# Patient Record
Sex: Male | Born: 1975 | Race: White | Hispanic: No | Marital: Married | Smoking: Never smoker
Health system: Southern US, Community
[De-identification: ages and names within clinical notes are randomized; demographics above are authoritative.]

## PROBLEM LIST (undated history)

## (undated) DIAGNOSIS — I10 Essential (primary) hypertension: Secondary | ICD-10-CM

## (undated) HISTORY — PX: HERNIA REPAIR: SHX51

## (undated) HISTORY — PX: SHOULDER SURGERY: SHX246

## (undated) HISTORY — PX: TONSILLECTOMY: SUR1361

## (undated) HISTORY — PX: VASCULAR SURGERY: SHX849

---

## 2011-12-28 ENCOUNTER — Emergency Department (INDEPENDENT_AMBULATORY_CARE_PROVIDER_SITE_OTHER)

## 2011-12-28 ENCOUNTER — Encounter (HOSPITAL_BASED_OUTPATIENT_CLINIC_OR_DEPARTMENT_OTHER): Payer: Self-pay

## 2011-12-28 ENCOUNTER — Other Ambulatory Visit: Payer: Self-pay

## 2011-12-28 ENCOUNTER — Emergency Department (HOSPITAL_BASED_OUTPATIENT_CLINIC_OR_DEPARTMENT_OTHER)
Admission: EM | Admit: 2011-12-28 | Discharge: 2011-12-28 | Disposition: A | Discharge status: Home / Self Care | Attending: Emergency Medicine | Admitting: Emergency Medicine

## 2011-12-28 DIAGNOSIS — R0989 Other specified symptoms and signs involving the circulatory and respiratory systems: Secondary | ICD-10-CM | POA: Insufficient documentation

## 2011-12-28 DIAGNOSIS — R0602 Shortness of breath: Secondary | ICD-10-CM | POA: Insufficient documentation

## 2011-12-28 DIAGNOSIS — R079 Chest pain, unspecified: Secondary | ICD-10-CM | POA: Insufficient documentation

## 2011-12-28 DIAGNOSIS — R911 Solitary pulmonary nodule: Secondary | ICD-10-CM

## 2011-12-28 DIAGNOSIS — Z9889 Other specified postprocedural states: Secondary | ICD-10-CM | POA: Insufficient documentation

## 2011-12-28 DIAGNOSIS — R0600 Dyspnea, unspecified: Secondary | ICD-10-CM

## 2011-12-28 DIAGNOSIS — R0609 Other forms of dyspnea: Secondary | ICD-10-CM | POA: Insufficient documentation

## 2011-12-28 LAB — COMPREHENSIVE METABOLIC PANEL
ALT: 19 U/L (ref 0–53)
Alkaline Phosphatase: 66 U/L (ref 39–117)
CO2: 23 mEq/L (ref 19–32)
Chloride: 103 mEq/L (ref 96–112)
GFR calc Af Amer: >90 mL/min (ref >90)
Glucose, Bld: 124 mg/dL — ABNORMAL HIGH (ref 70–99)
Potassium: 4.4 mEq/L (ref 3.5–5.1)
Sodium: 138 mEq/L (ref 135–145)
Total Bilirubin: 0.9 mg/dL (ref 0.3–1.2)
Total Protein: 7.7 g/dL (ref 6.0–8.3)

## 2011-12-28 LAB — DIFFERENTIAL
Eosinophils Absolute: 0 10*3/uL (ref 0.0–0.7)
Lymphocytes Relative: 8 % — ABNORMAL LOW (ref 12–46)
Lymphs Abs: 1.3 10*3/uL (ref 0.7–4.0)
Neutro Abs: 14.2 10*3/uL — ABNORMAL HIGH (ref 1.7–7.7)
Neutrophils Relative %: 87 % — ABNORMAL HIGH (ref 43–77)

## 2011-12-28 LAB — CBC
Platelets: 246 10*3/uL (ref 150–400)
RBC: 5.03 MIL/uL (ref 4.22–5.81)
WBC: 16.3 10*3/uL — ABNORMAL HIGH (ref 4.0–10.5)

## 2011-12-28 MED ORDER — IOHEXOL 350 MG/ML SOLN
80.0000 mL | Freq: Once | INTRAVENOUS | Status: AC | PRN
  Administered 2011-12-28: 80 mL via INTRAVENOUS

## 2011-12-28 NOTE — ED Notes (Signed)
Pt. Developed lt. Side chest pain under his lt. Breast around 0100 am, pain radiates into his lt. Neck area. Pt. Reports it has a 4/10, achy and intermittent spasms.  Pt. Reports intermittent sob. Pt. Also reported feeling sweaty during the night  Presently skin is w/d/p denies any n/v

## 2011-12-28 NOTE — ED Provider Notes (Signed)
History     CSN: [id number]  Arrival date & time 12/28/11  1343   First MD Initiated Contact with Patient 12/28/11 1357      Chief Complaint  Patient presents with  . Chest Pain    (Consider location/radiation/quality/duration/timing/severity/associated sxs/prior treatment) HPI Comments: Patient states this woke him from sleep this morning at about 1 AM and his been bothering him since.  Feels short of breath.  No injury or trauma.  Never had anything like this before.  Was seen at urgent care and sent here.  Patient is a 36 y.o. male presenting with chest pain. The history is provided by the patient.  Chest Pain Episode onset: 1 AM today. Chest pain occurs constantly. The chest pain is unchanged. The pain is associated with breathing and exertion. The severity of the pain is moderate. The quality of the pain is described as pressure-like. The pain does not radiate. Chest pain is worsened by exertion and deep breathing. Pertinent negatives for primary symptoms include no fever, no cough, no wheezing, no nausea and no vomiting.     History reviewed. No pertinent past medical history.  Past Surgical History  Procedure Date  . Vascular surgery     No family history on file.  History  Substance Use Topics  . Smoking status: Not on file  . Smokeless tobacco: Not on file  . Alcohol Use: No      Review of Systems  Constitutional: Negative for fever.  Respiratory: Negative for cough and wheezing.   Cardiovascular: Positive for chest pain.  Gastrointestinal: Negative for nausea and vomiting.  All other systems reviewed and are negative.    Allergies  Naproxen  Home Medications  No current outpatient prescriptions on file.  BP 153/77  Pulse 80  Temp(Src) 98.4 F (36.9 C) (Oral)  Resp 18  SpO2 100%  Physical Exam  Nursing note and vitals reviewed. Constitutional: He is oriented to person, place, and time. He appears well-developed and well-nourished.  HENT:    Head: Normocephalic and atraumatic.  Neck: Normal range of motion. Neck supple.  Cardiovascular: Normal rate and regular rhythm.   No murmur heard. Pulmonary/Chest: Effort normal and breath sounds normal. No respiratory distress.  Abdominal: Soft. Bowel sounds are normal. He exhibits no distension. There is no tenderness.  Musculoskeletal: Normal range of motion. He exhibits no edema.  Neurological: He is alert and oriented to person, place, and time.  Skin: Skin is warm and dry. He is not diaphoretic. No erythema.    ED Course  Procedures (including critical care time)   Labs Reviewed  CBC  DIFFERENTIAL  COMPREHENSIVE METABOLIC PANEL  TROPONIN I   No results found.   No diagnosis found.   Date: 12/28/2011  Rate: 78  Rhythm: normal sinus rhythm  QRS Axis: normal  Intervals: normal  ST/T Wave abnormalities: normal  Conduction Disutrbances:none  Narrative Interpretation:   Old EKG Reviewed: none available    MDM  The ekg and labs look okay.  There is no acute process on chest xray.  He was ambulated and felt short of breath, but sats stayed above 95%.  I then ordered a CT angio of the chest which did not reveal a PE.  I will discharge him to home.  I do not suspect a cardiac etiology as the ekg and troponin are negative.  He will follow up with his pcp and return to the ER if symptoms worsen.          Riley Lam  Katieann Hungate, MD 12/28/11 [phone]

## 2011-12-28 NOTE — ED Notes (Signed)
Ambulated patient around the department oxygen saturation dropped to 95 once the rest of the time it was 97 - 100 percent pts heart rate increased to 128 at his greatest exertion states when he got winded it he wasn't having worse pain but did feel like he couldn't get enough breath or a deep enough breath

## 2011-12-28 NOTE — Discharge Instructions (Signed)
Shortness of Breath Shortness of breath (dyspnea) is the feeling of uneasy breathing. Shortness of breath does not always mean that there is a life-threatening illness. However, shortness of breath requires immediate medical care. CAUSES  Causes for shortness of breath include:  Not enough oxygen in the air (as with high altitudes or with a smoke-filled room).   Short-term (acute) lung disease, including:   Infections such as pneumonia.   Fluid in the lungs, such as heart failure.   A blood clot in the lungs (pulmonary embolism).   Lasting (chronic) lung diseases.   Heart disease (heart attack, angina, heart failure, and others).   Low red blood cells (anemia).   Poor physical fitness. This can cause shortness of breath when you exercise.   Chest or back injuries or stiffness.   Being overweight (obese).   Anxiety. This can make you feel like you are not getting enough air.  DIAGNOSIS  Serious medical problems can usually be found during your physical exam. Many tests may also be done to determine why you are having shortness of breath. Tests include:  Chest X-rays.   Lung function tests.   Blood tests.   Electrocardiography.   Exercise testing.   A cardiac echo.   Imaging scans.  Your caregiver may not be able to find a cause for your shortness of breath after your exam. In this case, it is important to have a follow-up exam with your caregiver as directed.  HOME CARE INSTRUCTIONS   Do not smoke. Smoking is a common cause of shortness of breath. Ask for help to stop smoking.   Avoid being around chemicals that may bother your breathing (paint fumes, dust).   Rest as needed. Slowly resume your usual activities.   If medicines were prescribed, take them as directed for the full length of time directed. This includes oxygen and any inhaled medicines.   Follow up with your caregiver as directed. Waiting to do so or failure to follow up could result in worsening of  your condition and possible disability or death.   Be sure you understand what to do or who to call if your shortness of breath worsens.  SEEK MEDICAL CARE IF:   Your condition does not improve in the time expected.   You have a hard time doing your normal activities even with rest.   You have any side effects or problems with the medicines prescribed.   You develop any new symptoms.  SEEK IMMEDIATE MEDICAL CARE IF:   Your shortness of breath is getting worse.   You feel lightheaded, faint, or develop a cough not controlled with medicines.   You start coughing up blood.   You have pain with breathing.   You have chest pain or pain in your arms, shoulders, or abdomen.   You have a fever.   You are unable to walk up stairs or exercise the way you normally do.   Your symptoms are getting worse.  Document Released: 06/26/2001 Document Revised: 09/20/2011 Document Reviewed: 02/11/2008 [employer] Patient Information 2012 [employer]. 

## 2012-05-23 ENCOUNTER — Encounter (HOSPITAL_BASED_OUTPATIENT_CLINIC_OR_DEPARTMENT_OTHER): Payer: Self-pay | Admitting: *Deleted

## 2012-05-23 ENCOUNTER — Emergency Department (HOSPITAL_BASED_OUTPATIENT_CLINIC_OR_DEPARTMENT_OTHER)

## 2012-05-23 ENCOUNTER — Emergency Department (HOSPITAL_BASED_OUTPATIENT_CLINIC_OR_DEPARTMENT_OTHER)
Admission: EM | Admit: 2012-05-23 | Discharge: 2012-05-23 | Disposition: A | Discharge status: Home / Self Care | Attending: Emergency Medicine | Admitting: Emergency Medicine

## 2012-05-23 DIAGNOSIS — Z886 Allergy status to analgesic agent status: Secondary | ICD-10-CM | POA: Insufficient documentation

## 2012-05-23 DIAGNOSIS — IMO0002 Reserved for concepts with insufficient information to code with codable children: Secondary | ICD-10-CM | POA: Insufficient documentation

## 2012-05-23 DIAGNOSIS — S46911A Strain of unspecified muscle, fascia and tendon at shoulder and upper arm level, right arm, initial encounter: Secondary | ICD-10-CM

## 2012-05-23 DIAGNOSIS — X500XXA Overexertion from strenuous movement or load, initial encounter: Secondary | ICD-10-CM | POA: Insufficient documentation

## 2012-05-23 DIAGNOSIS — Z79899 Other long term (current) drug therapy: Secondary | ICD-10-CM | POA: Insufficient documentation

## 2012-05-23 MED ORDER — OXYCODONE-ACETAMINOPHEN 5-325 MG PO TABS: 1.0000 | ORAL_TABLET | ORAL | Status: AC | PRN

## 2012-05-23 NOTE — ED Notes (Signed)
Pt c/o right shoulder injury x 2 days ago 

## 2012-05-26 NOTE — ED Provider Notes (Signed)
History     CSN: [id number]  Arrival date & time 05/23/12  1442   First MD Initiated Contact with Patient 05/23/12 1701      Chief Complaint  Patient presents with  . Shoulder Injury    (Consider location/radiation/quality/duration/timing/severity/associated sxs/prior treatment) HPI Comments: Troy Osborn presents with a 2 day history of right shoulder pain after performing pull ups during his work out routine.  He does have a history of intermittent right shoulder pain, in fact is awaiting evaluation by an orthopedist with the [employer] for the possibility of a rotator cuff injury.  Pain is constant,  Worse with attempts to range the joint and described as aching,  Toothache quality pain.  He takes mobic daily but this has not relieved his symptoms.  He denies radiation of pain and denies weakness in his upper extremity.  He also denies chest pain and shortness of breath.  The history is provided by the patient.    History reviewed. No pertinent past medical history.  Past Surgical History  Procedure Date  . Vascular surgery   . Tonsillectomy     History reviewed. No pertinent family history.  History  Substance Use Topics  . Smoking status: Not on file  . Smokeless tobacco: Not on file  . Alcohol Use: No      Review of Systems  Respiratory: Negative for chest tightness and shortness of breath.   Cardiovascular: Negative for chest pain.  Musculoskeletal: Positive for joint swelling and arthralgias.  Skin: Negative for wound.  Neurological: Negative for weakness and numbness.    Allergies  Naproxen  Home Medications   Current Outpatient Rx  Name Route Sig Dispense Refill  . HYDROCHLOROTHIAZIDE 25 MG PO TABS Oral Take 25 mg by mouth daily.    . MELOXICAM 15 MG PO TABS Oral Take 15 mg by mouth daily.    Marland Kitchen TEMAZEPAM 15 MG PO CAPS Oral Take 15 mg by mouth at bedtime as needed. Patient uses this medication for sleep.    Marland Kitchen TRAMADOL HCL 50 MG PO TABS Oral Take 50 mg by  mouth every 6 (six) hours as needed. Patient uses this medication for pain.    . OXYCODONE-ACETAMINOPHEN 5-325 MG PO TABS Oral Take 1 tablet by mouth every 4 (four) hours as needed for pain. 20 tablet 0  . TESTOSTERONE IM Intramuscular Inject 1 mL into the muscle once a week. Patient receives the injection every 10 days.      BP 140/91  Pulse 71  Temp 98.4 F (36.9 C) (Oral)  Resp 16  Ht 6\' 1"  (1.854 m)  Wt 219 lb (99.338 kg)  BMI 28.89 kg/m2  SpO2 100%  Physical Exam  Constitutional: He appears well-developed and well-nourished.  HENT:  Head: Atraumatic.  Neck: Normal range of motion. Neck supple.  Cardiovascular: Normal rate.        Pulses equal bilaterally  Pulmonary/Chest: Effort normal and breath sounds normal.  Musculoskeletal:       Right shoulder: He exhibits decreased range of motion, bony tenderness and crepitus. He exhibits no swelling, no effusion, no deformity, no spasm and normal pulse.       He does have appreciable crepitus in right shoulder most pronounced over AC joint.  Neurological: He is alert. He has normal strength. He displays normal reflexes. No sensory deficit.       Equal grip strength  Skin: Skin is warm and dry.  Psychiatric: He has a normal mood and affect.    ED  Course  Procedures (including critical care time)  Labs Reviewed - No data to display No results found.   1. Right shoulder strain        MDM  xrays reviewed prior to dc home and discussed with pt.  Pt with acute on chronic right shoulder pain with normal xray.  Suspect cartilaginous injury, debris in joint capsule.  Pt does state the shoulder "catches" at times with FROM.  Pt encouraged to f/u with ortho - may need to have MRI of shoulder since plain films negative.  Pt prescribed oxycodone.  Sling supplied.        Burgess Amor, [place] 05/26/12 [phone]

## 2012-05-26 NOTE — ED Provider Notes (Signed)
Medical screening examination/treatment/procedure(s) were performed by non-physician practitioner and as supervising physician I was immediately available for consultation/collaboration.   Hurman Horn, MD 05/26/12 [phone]

## 2014-09-08 ENCOUNTER — Emergency Department (HOSPITAL_BASED_OUTPATIENT_CLINIC_OR_DEPARTMENT_OTHER)

## 2014-09-08 ENCOUNTER — Emergency Department (HOSPITAL_BASED_OUTPATIENT_CLINIC_OR_DEPARTMENT_OTHER)
Admission: EM | Admit: 2014-09-08 | Discharge: 2014-09-08 | Disposition: A | Discharge status: Home / Self Care | Attending: Emergency Medicine | Admitting: Emergency Medicine

## 2014-09-08 DIAGNOSIS — Z791 Long term (current) use of non-steroidal anti-inflammatories (NSAID): Secondary | ICD-10-CM | POA: Diagnosis not present

## 2014-09-08 DIAGNOSIS — Y92410 Unspecified street and highway as the place of occurrence of the external cause: Secondary | ICD-10-CM | POA: Diagnosis not present

## 2014-09-08 DIAGNOSIS — Y9355 Activity, bike riding: Secondary | ICD-10-CM | POA: Insufficient documentation

## 2014-09-08 DIAGNOSIS — T1490XA Injury, unspecified, initial encounter: Secondary | ICD-10-CM

## 2014-09-08 DIAGNOSIS — Z79899 Other long term (current) drug therapy: Secondary | ICD-10-CM | POA: Insufficient documentation

## 2014-09-08 DIAGNOSIS — M25461 Effusion, right knee: Secondary | ICD-10-CM

## 2014-09-08 DIAGNOSIS — Y998 Other external cause status: Secondary | ICD-10-CM | POA: Diagnosis not present

## 2014-09-08 DIAGNOSIS — S80211A Abrasion, right knee, initial encounter: Secondary | ICD-10-CM | POA: Insufficient documentation

## 2014-09-08 DIAGNOSIS — S8991XA Unspecified injury of right lower leg, initial encounter: Secondary | ICD-10-CM | POA: Diagnosis present

## 2014-09-08 MED ORDER — HYDROCODONE-ACETAMINOPHEN 5-325 MG PO TABS: 2.0000 | ORAL_TABLET | ORAL | Status: DC | PRN
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
## 2014-09-08 NOTE — Discharge Instructions (Signed)

## 2015-07-30 ENCOUNTER — Encounter (HOSPITAL_BASED_OUTPATIENT_CLINIC_OR_DEPARTMENT_OTHER): Payer: Self-pay | Admitting: Emergency Medicine

## 2015-07-30 ENCOUNTER — Emergency Department (HOSPITAL_BASED_OUTPATIENT_CLINIC_OR_DEPARTMENT_OTHER)

## 2015-07-30 ENCOUNTER — Emergency Department (HOSPITAL_BASED_OUTPATIENT_CLINIC_OR_DEPARTMENT_OTHER)
Admission: EM | Admit: 2015-07-30 | Discharge: 2015-07-30 | Disposition: A | Discharge status: Home / Self Care | Attending: Emergency Medicine | Admitting: Emergency Medicine

## 2015-07-30 DIAGNOSIS — R55 Syncope and collapse: Secondary | ICD-10-CM | POA: Insufficient documentation

## 2015-07-30 DIAGNOSIS — Y92410 Unspecified street and highway as the place of occurrence of the external cause: Secondary | ICD-10-CM | POA: Insufficient documentation

## 2015-07-30 DIAGNOSIS — S20419A Abrasion of unspecified back wall of thorax, initial encounter: Secondary | ICD-10-CM | POA: Insufficient documentation

## 2015-07-30 DIAGNOSIS — S199XXA Unspecified injury of neck, initial encounter: Secondary | ICD-10-CM | POA: Insufficient documentation

## 2015-07-30 DIAGNOSIS — S3991XA Unspecified injury of abdomen, initial encounter: Secondary | ICD-10-CM | POA: Insufficient documentation

## 2015-07-30 DIAGNOSIS — Y9389 Activity, other specified: Secondary | ICD-10-CM | POA: Insufficient documentation

## 2015-07-30 DIAGNOSIS — S82422A Displaced transverse fracture of shaft of left fibula, initial encounter for closed fracture: Secondary | ICD-10-CM | POA: Insufficient documentation

## 2015-07-30 DIAGNOSIS — T07XXXA Unspecified multiple injuries, initial encounter: Secondary | ICD-10-CM

## 2015-07-30 DIAGNOSIS — M79606 Pain in leg, unspecified: Secondary | ICD-10-CM

## 2015-07-30 DIAGNOSIS — S50311A Abrasion of right elbow, initial encounter: Secondary | ICD-10-CM | POA: Insufficient documentation

## 2015-07-30 DIAGNOSIS — S50812A Abrasion of left forearm, initial encounter: Secondary | ICD-10-CM | POA: Insufficient documentation

## 2015-07-30 DIAGNOSIS — I10 Essential (primary) hypertension: Secondary | ICD-10-CM | POA: Insufficient documentation

## 2015-07-30 DIAGNOSIS — S82402A Unspecified fracture of shaft of left fibula, initial encounter for closed fracture: Secondary | ICD-10-CM

## 2015-07-30 DIAGNOSIS — Y998 Other external cause status: Secondary | ICD-10-CM | POA: Insufficient documentation

## 2015-07-30 HISTORY — DX: Essential (primary) hypertension: I10

## 2015-07-30 MED ORDER — OXYCODONE-ACETAMINOPHEN 5-325 MG PO TABS
2.0000 | ORAL_TABLET | Freq: Once | ORAL | Status: AC
Start: 2015-07-30 — End: 2015-07-30
  Administered 2015-07-30: 2 via ORAL
  Filled 2015-07-30: qty 2

## 2015-07-30 MED ORDER — OXYCODONE-ACETAMINOPHEN 5-325 MG PO TABS: 2.0000 | ORAL_TABLET | Freq: Once | ORAL | Status: DC

## 2015-07-30 MED ORDER — OXYCODONE-ACETAMINOPHEN 5-325 MG PO TABS: 1.0000 | ORAL_TABLET | Freq: Three times a day (TID) | ORAL | Status: AC | PRN

## 2015-07-30 MED ORDER — ONDANSETRON HCL 4 MG/2ML IJ SOLN
4.0000 mg | Freq: Once | INTRAMUSCULAR | Status: AC
  Administered 2015-07-30: 4 mg via INTRAVENOUS
  Filled 2015-07-30: qty 2

## 2015-07-30 MED ORDER — HYDROMORPHONE HCL 1 MG/ML IJ SOLN
1.0000 mg | Freq: Once | INTRAMUSCULAR | Status: AC
  Administered 2015-07-30: 1 mg via INTRAVENOUS
  Filled 2015-07-30: qty 1

## 2015-07-30 MED ORDER — SODIUM CHLORIDE 0.9 % IV BOLUS (SEPSIS)
1000.0000 mL | Freq: Once | INTRAVENOUS | Status: AC
  Administered 2015-07-30: 1000 mL via INTRAVENOUS

## 2015-07-30 NOTE — ED Notes (Signed)
Patient transported to X-ray 

## 2015-07-30 NOTE — ED Notes (Signed)
Patient transported from  X-ray to room 5
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
